# Patient Record
Sex: Male | Born: 1955 | Race: White | Hispanic: No | Marital: Single | Smoking: Former smoker
Health system: Southern US, Community
[De-identification: ages and names within clinical notes are randomized; demographics above are authoritative.]

## PROBLEM LIST (undated history)

## (undated) DIAGNOSIS — C801 Malignant (primary) neoplasm, unspecified: Secondary | ICD-10-CM

## (undated) DIAGNOSIS — Z87442 Personal history of urinary calculi: Secondary | ICD-10-CM

## (undated) DIAGNOSIS — I10 Essential (primary) hypertension: Secondary | ICD-10-CM

## (undated) DIAGNOSIS — M1990 Unspecified osteoarthritis, unspecified site: Secondary | ICD-10-CM

## (undated) DIAGNOSIS — R222 Localized swelling, mass and lump, trunk: Secondary | ICD-10-CM

## (undated) DIAGNOSIS — K219 Gastro-esophageal reflux disease without esophagitis: Secondary | ICD-10-CM

## (undated) HISTORY — PX: OTHER SURGICAL HISTORY: SHX169

## (undated) HISTORY — PX: SHOULDER SURGERY: SHX246

## (undated) HISTORY — DX: Unspecified osteoarthritis, unspecified site: M19.90

## (undated) HISTORY — DX: Localized swelling, mass and lump, trunk: R22.2

## (undated) HISTORY — DX: Gastro-esophageal reflux disease without esophagitis: K21.9

## (undated) HISTORY — DX: Malignant (primary) neoplasm, unspecified: C80.1

## (undated) HISTORY — DX: Personal history of urinary calculi: Z87.442

## (undated) HISTORY — DX: Essential (primary) hypertension: I10

---

## 2007-11-30 DIAGNOSIS — G4700 Insomnia, unspecified: Secondary | ICD-10-CM | POA: Insufficient documentation

## 2020-03-04 ENCOUNTER — Telehealth: Payer: Self-pay | Admitting: Hematology and Oncology

## 2020-03-04 NOTE — Telephone Encounter (Signed)
I spoke to [place], RN from [facility] regarding Dr. Calton Dach recommendations to sending the pt to general surgery for workup. [place] aware once workup is completed by the surgeon, the pt will be referred to our office.

## 2020-04-02 ENCOUNTER — Other Ambulatory Visit: Payer: Self-pay | Admitting: Surgery

## 2020-04-02 DIAGNOSIS — R911 Solitary pulmonary nodule: Secondary | ICD-10-CM

## 2020-04-14 ENCOUNTER — Ambulatory Visit (HOSPITAL_COMMUNITY)
Admission: RE | Admit: 2020-04-14 | Discharge: 2020-04-14 | Disposition: A | Discharge status: Home / Self Care | Payer: Medicare Other | Source: Ambulatory Visit | Attending: Surgery | Admitting: Surgery

## 2020-04-14 ENCOUNTER — Other Ambulatory Visit: Payer: Self-pay

## 2020-04-14 DIAGNOSIS — R911 Solitary pulmonary nodule: Secondary | ICD-10-CM | POA: Insufficient documentation

## 2020-04-14 DIAGNOSIS — R599 Enlarged lymph nodes, unspecified: Secondary | ICD-10-CM | POA: Insufficient documentation

## 2020-04-14 LAB — GLUCOSE, CAPILLARY
Glucose-Capillary: 48 mg/dL — ABNORMAL LOW (ref 70–99)
Glucose-Capillary: 99 mg/dL (ref 70–99)

## 2020-04-14 MED ORDER — FLUDEOXYGLUCOSE F - 18 (FDG) INJECTION
10.1000 | Freq: Once | INTRAVENOUS | Status: AC | PRN
  Administered 2020-04-14: 10.1 via INTRAVENOUS

## 2020-04-24 ENCOUNTER — Ambulatory Visit (INDEPENDENT_AMBULATORY_CARE_PROVIDER_SITE_OTHER): Payer: Medicare Other | Admitting: Internal Medicine

## 2020-04-24 ENCOUNTER — Other Ambulatory Visit: Payer: Self-pay

## 2020-04-24 DIAGNOSIS — R911 Solitary pulmonary nodule: Secondary | ICD-10-CM

## 2020-04-24 LAB — PULMONARY FUNCTION TEST
DL/VA % pred: 105 %
DL/VA: 4.36 ml/min/mmHg/L
DLCO cor % pred: 90 %
DLCO cor: 24.07 ml/min/mmHg
DLCO unc % pred: 90 %
DLCO unc: 24.07 ml/min/mmHg
FEF 25-75 Post: 3.42 L/sec
FEF 25-75 Pre: 2.73 L/sec
FEF2575-%Change-Post: 25 %
FEF2575-%Pred-Post: 127 %
FEF2575-%Pred-Pre: 102 %
FEV1-%Change-Post: 6 %
FEV1-%Pred-Post: 86 %
FEV1-%Pred-Pre: 80 %
FEV1-Post: 2.94 L
FEV1-Pre: 2.75 L
FEV1FVC-%Change-Post: 3 %
FEV1FVC-%Pred-Pre: 107 %
FEV6-%Change-Post: 3 %
FEV6-%Pred-Post: 81 %
FEV6-%Pred-Pre: 79 %
FEV6-Post: 3.55 L
FEV6-Pre: 3.44 L
FEV6FVC-%Change-Post: 0 %
FEV6FVC-%Pred-Post: 105 %
FEV6FVC-%Pred-Pre: 105 %
FVC-%Change-Post: 3 %
FVC-%Pred-Post: 77 %
FVC-%Pred-Pre: 75 %
FVC-Post: 3.56 L
FVC-Pre: 3.44 L
Post FEV1/FVC ratio: 83 %
Post FEV6/FVC ratio: 100 %
Pre FEV1/FVC ratio: 80 %
Pre FEV6/FVC Ratio: 100 %
RV % pred: 89 %
RV: 2.11 L
TLC % pred: 79 %
TLC: 5.59 L

## 2020-04-24 NOTE — Progress Notes (Signed)
Full PFT completed today ? ?

## 2020-04-30 ENCOUNTER — Other Ambulatory Visit: Payer: Self-pay

## 2020-04-30 DIAGNOSIS — N529 Male erectile dysfunction, unspecified: Secondary | ICD-10-CM | POA: Insufficient documentation

## 2020-04-30 DIAGNOSIS — I10 Essential (primary) hypertension: Secondary | ICD-10-CM | POA: Insufficient documentation

## 2020-05-01 ENCOUNTER — Other Ambulatory Visit: Payer: Self-pay | Admitting: *Deleted

## 2020-05-01 ENCOUNTER — Other Ambulatory Visit: Payer: Self-pay

## 2020-05-01 ENCOUNTER — Institutional Professional Consult (permissible substitution) (INDEPENDENT_AMBULATORY_CARE_PROVIDER_SITE_OTHER): Payer: Medicare Other | Admitting: Thoracic Surgery (Cardiothoracic Vascular Surgery)

## 2020-05-01 ENCOUNTER — Encounter: Payer: Self-pay | Admitting: Thoracic Surgery (Cardiothoracic Vascular Surgery)

## 2020-05-01 VITALS — BP 155/88 | HR 60 | Temp 98.1°F | Resp 20 | Ht 71.0 in | Wt 201.0 lb

## 2020-05-01 DIAGNOSIS — R222 Localized swelling, mass and lump, trunk: Secondary | ICD-10-CM | POA: Insufficient documentation

## 2020-05-01 DIAGNOSIS — R911 Solitary pulmonary nodule: Secondary | ICD-10-CM

## 2020-05-01 NOTE — Progress Notes (Signed)
[address]       [place] [address]             [phone]                    [name] Medical Record #[id number] Date of Birth: 1956/01/06  Referring: Jesusita Oka, MD Primary Care: Patient, No Pcp Per Primary Cardiologist: No primary care provider on file.  Chief Complaint:    Chief Complaint  Patient presents with  . Lung Lesion    Surgical consult, PFT's 04/24/20, PET Scan 04/14/20, CTA Chest 03/12/20    History of Present Illness:    [name] 64 y.o. male presents for surgical evaluation of a right-sided pleural based chest wall mass.  The patient states that in July of this year he sought care for right axillary pain and swelling.  He was prescribed antibiotics by an urgent care and had some improvement but the pain persisted.  He then presented to the emergency department where he underwent cross-sectional imaging which identified a right pleural-based mass.  In regards to his symptoms he complains of throbbing pain starting in his back with numbness and tingling that radiates around his chest wall and sticks to 1 main band.  The pain is constant he is unsure of any relieving or aggravating factors.  He states that he occasionally does have some shortness of breath when climbing up 2 flights of stairs but has 25 acres and walks it regularly without any chest pain or dyspnea.  His weight and energy have been stable.  He has had some stress about what is going on with his pain.  He last had a colonoscopy about 4 years ago at which time polyps were removed but there was no evidence of malignancy.  This is all per his report.  Smoking Hx: Quit smoking in 1984   Zubrod Score: At the time of surgery this patient's most appropriate activity status/level should be described as: [x]     0    Normal activity, no symptoms []     1    Restricted in physical strenuous activity but ambulatory, able to do out light work []     2     Ambulatory and capable of self care, unable to do work activities, up and about               >50 % of waking hours                              []     3    Only limited self care, in bed greater than 50% of waking hours []     4    Completely disabled, no self care, confined to bed or chair []     5    Moribund   Past Medical History:  Diagnosis Date  . Chest wall mass   . GERD (gastroesophageal reflux disease)   . Hypertension      No family history on file.   Social History   Tobacco Use  Smoking Status Former Smoker  Tobacco Comment   stoppedf smoking cigarettes in 1985    Social History   Substance and Sexual Activity  Alcohol Use Yes     Allergies  Allergen Reactions  . Aspirin Other (See Comments)  . Ibuprofen     Other reaction(s): Abdominal Pain  Current Outpatient Medications  Medication Sig Dispense Refill  . bisoprolol-hydrochlorothiazide (ZIAC) 10-6.25 MG tablet bisoprolol 10 mg-hydrochlorothiazide 6.25 mg tablet  TAKE 1 TABLET BY MOUTH ONCE DAILY AS DIRECTED FOR 30 DAYS    . gabapentin (NEURONTIN) 100 MG capsule TAKE 1 CAPSULE BY MOUTH EVERY 8 HOURS FOR 1 WEEK THEN TAKE 2 CAPSULES EVERY 8 HOURS FOR 1 WEEK THEN TAKE 3 CAPSULES EVERY 8 HOURS    . HYDROcodone-acetaminophen (NORCO/VICODIN) 5-325 MG tablet     . omeprazole (PRILOSEC) 20 MG capsule Take 20 mg by mouth daily.    . pravastatin (PRAVACHOL) 10 MG tablet Take by mouth.    Marland Kitchen tiZANidine (ZANAFLEX) 4 MG tablet Take 4 mg by mouth every 6 (six) hours as needed.     No current facility-administered medications for this visit.    Review of Systems  Constitutional: Negative.   Respiratory: Positive for shortness of breath.   Cardiovascular: Negative.   Gastrointestinal: Negative.   Musculoskeletal: Positive for back pain and myalgias.  Neurological: Positive for tingling.  Psychiatric/Behavioral: The patient is nervous/anxious.      PHYSICAL EXAMINATION: BP (!) 155/88   Pulse 60   Temp 98.1  F (36.7 C) (Skin)   Resp 20   Ht 5\' 11"  (1.803 m)   Wt 201 lb (91.2 kg)   SpO2 98% Comment: RA  BMI 28.03 kg/m  Physical Exam Constitutional:      General: He is not in acute distress.    Appearance: Normal appearance. He is normal weight. He is not ill-appearing.  HENT:     Head: Normocephalic and atraumatic.  Eyes:     Extraocular Movements: Extraocular movements intact.     Conjunctiva/sclera: Conjunctivae normal.  Cardiovascular:     Rate and Rhythm: Normal rate.  Pulmonary:     Effort: Pulmonary effort is normal. No respiratory distress.  Abdominal:     General: Abdomen is flat. There is no distension.  Musculoskeletal:        General: Normal range of motion.     Cervical back: Normal range of motion.     Comments: No obvious chest wall defect.  Skin:    General: Skin is warm and dry.  Neurological:     General: No focal deficit present.     Mental Status: He is alert and oriented to person, place, and time.     Sensory: Sensory deficit present.     Comments: Sensory deficits along the lateral right and anterior chest wall.     Diagnostic Studies & Laboratory data:     Recent Radiology Findings:   NM PET Image Initial (PI) Skull Base To Thigh  Result Date: 04/14/2020 CLINICAL DATA:  Initial treatment strategy for lung nodule. EXAM: NUCLEAR MEDICINE PET SKULL BASE TO THIGH TECHNIQUE: 10.1 mCi F-18 FDG was injected intravenously. Full-ring PET imaging was performed from the skull base to thigh after the radiotracer. CT data was obtained and used for attenuation correction and anatomic localization. Fasting blood glucose: 99 mg/dl COMPARISON:  None. FINDINGS: Mediastinal blood pool activity: SUV max 2.4 Liver activity: SUV max 3.66 NECK: No hypermetabolic lymph nodes in the neck. Incidental CT findings: none CHEST: Soft tissue nodule along the peripheral RIGHT chest appears extrapleural based on the relationship to extrapleural fat and the appearance of "ball under a rug"  this measures 2.1 x 1.6 cm (image 77, series 4) (SUVmax = 3.4) Mild increased FDG uptake about the RIGHT shoulder with scattered small lymph nodes in this area displaying fatty  hila. No focal lesion seen in the area of the RIGHT shoulder and most of this activity appears muscular or related to shoulder degenerative changes. Small lymph node on image 62 of series 4 with a maximum SUV of 3.6. No LEFT axillary nodal activity at least none with the same degree of activity. Incidental CT findings: Lungs are clear. No effusion. No consolidation. Airways are Calcified generalized atherosclerosis and coronary artery disease. Normal heart size. No pericardial effusion. Normal appearance of the esophagus. ABDOMEN/PELVIS: No focal hepatic uptake, splenic uptake or solid organ uptake. Increased metabolic activity of periportal lymph nodes (image 113, series 4) 1.2 cm celiac lymph node (SUVmax = 5.5) On the same image a similar size lymph node, portacaval lymph node with increased metabolic activity (SUVmax = 5.5) No additional suspicious areas of uptake in the abdomen or pelvis. Incidental CT findings: Splenic activity less than liver without splenomegaly. No acute gastrointestinal process. Calcified atheromatous plaque of the abdominal aorta, mild and without aneurysmal dilation. SKELETON: No focal hypermetabolic activity to suggest skeletal metastasis. Incidental CT findings: Extensive spinal degenerative changes. IMPRESSION: 1. Nodule that appears extrapleural associated with small amount calcification along the RIGHT chest wall shows low level FDG uptake and well-circumscribed margins. Would consider fibrous tumor of the pleura. Indolent neoplasm is favored. A metastatic lesion could have a similar appearance. Correlate with any history of neoplasm. 2. Increased FDG uptake about periportal lymph nodes greater than liver with mild enlargement are of uncertain significance. Metastatic process or reactive changes are in the  differential. There is some mild nodularity of hepatic contours as well which raises the question of background liver disease. Correlation with liver enzymes may be helpful. 3. Mild activity about the RIGHT shoulder and small nodes in the RIGHT axilla. By report the patient received COVID-19 vaccination but history states LEFT arm. Correlate with the side of vaccination as these findings could be explained by COVID-19 vaccine and consider clinical follow-up based on appearance. Electronically Signed   By: Zetta Bills M.D.   On: 04/14/2020 10:04       I have independently reviewed the above radiology studies  and reviewed the findings with the patient.   Recent Lab Findings: No results found for: WBC, HGB, HCT, PLT, GLUCOSE, CHOL, TRIG, HDL, LDLDIRECT, LDLCALC, ALT, AST, NA, K, CL, CREATININE, BUN, CO2, TSH, INR, GLUF, HGBA1C    Problem List: 2.1 cm right lateral pleural-based nodule. Hepatic lymphadenopathy with increased SUV uptake.  Assessment / Plan:   64 year old male with a 2.1 cm lateral chest wall lesion.  It appears pleural based on imaging.  There is not significant uptake on PET CT, and there is evidence of calcifications within the lesion indicating some chronicity.  Given his symptoms I am concerned that there is an intercostal nerve involvement and explained to him that for resection the paresthesias would improve proved but he likely would have some continued numbness.  Additionally he has some hepatic lymphadenopathy with increased SUV uptake which is concerning.  There is concern that this along with the pleural-based lesion could be metastatic disease from an unknown source.  I recommended that he undergo right robotic assisted thoracoscopy with resection of this pleural-based mass.  Hopefully this will help [place] better delineate the potential source of his abdominal lymphadenopathy as well.  I will also discuss his case with [name] for any recommendations for his abdominal  lymphadenopathy.  The risks, and benefits have been discussed and he is agreeable to proceed.  He is tentatively  scheduled for September 29 as a first case.     I  spent 40 minutes with  the patient face to face and greater then 50% of the time was spent in counseling and coordination of care.    Lajuana Matte 05/01/2020 12:47 PM

## 2020-05-01 NOTE — Pre-Procedure Instructions (Addendum)
[name]  05/01/2020    Your procedure is scheduled on Wednesday, Sept. 29, 2021 at 7:30 AM.   Report to [facility] Entrance "A" Admitting Office at 5:30 AM.   Call this number if you have problems the morning of surgery: [phone]   Questions prior to day of surgery, please call [phone] between 8 & 4 PM.   Remember:  Do not eat or drink after midnight Tuesday, 05/05/20.  Take these medicines the morning of surgery with A SIP OF WATER: Bisoprolol-Hydrochlorathiazide (Ziac), Omeprazole (Prilosec), Hydrocodone - if needed, Tizanidine (Zanaflex) - if needed  Do not use any NSAIDS (Ibuprofen, Aleve, etc), Aspirin containing products, Herbal medications, Multivitamins or Fish Oil prior to surgery.    Do not wear jewelry.  Do not wear lotions, powders, cologne or deodorant.  Men may shave face and neck.  Do not bring valuables to the hospital.  [facility] is not responsible for any belongings or valuables.  Contacts, dentures or bridgework may not be worn into surgery.  Leave your suitcase in the car.  After surgery it may be brought to your room.  For patients admitted to the hospital, discharge time will be determined by your treatment team.  [facility] - Preparing for Surgery  Before surgery, you can play an important role.  Because skin is not sterile, your skin needs to be as free of germs as possible.  You can reduce the number of germs on you skin by washing with CHG (chlorahexidine gluconate) soap before surgery.  CHG is an antiseptic cleaner which kills germs and bonds with the skin to continue killing germs even after washing.  Oral Hygiene is also important in reducing the risk of infection.  Remember to brush your teeth with your regular toothpaste the morning of surgery.  Please DO NOT use if you have an allergy to CHG or antibacterial soaps.  If your skin becomes reddened/irritated stop using the CHG and inform your nurse when you arrive at Short  Stay.  Do not shave (including legs and underarms) for at least 48 hours prior to the first CHG shower.  You may shave your face.  Please follow these instructions carefully:   1.  Shower with CHG Soap the night before surgery and the morning of Surgery.  2.  If you choose to wash your hair, wash your hair first as usual with your normal shampoo.  3.  After you shampoo, rinse your hair and body thoroughly to remove the shampoo. 4.  Use CHG as you would any other liquid soap.  You can apply chg directly to the skin and wash gently with a      scrungie or washcloth.           5.  Apply the CHG Soap to your body ONLY FROM THE NECK DOWN.   Do not use on open wounds or open sores. Avoid contact with your eyes, ears, mouth and genitals (private parts).  Wash genitals (private parts) with your normal soap - do this prior to using CHG soap.  6.  Wash thoroughly, paying special attention to the area where your surgery will be performed.  7.  Thoroughly rinse your body with warm water from the neck down.  8.  DO NOT shower/wash with your normal soap after using and rinsing off the CHG Soap.  9.  Pat yourself dry with a clean towel.            10.  Wear clean  pajamas.            11.  Place clean sheets on your bed the night of your first shower and do not sleep with pets.  Day of Surgery  Shower as above. Do not apply any lotions/deodorants the morning of surgery.   Please wear clean clothes to the hospital. Remember to brush your teeth with toothpaste.  Please read over the fact sheets that you were given.

## 2020-05-04 ENCOUNTER — Encounter (HOSPITAL_COMMUNITY): Payer: Self-pay

## 2020-05-04 ENCOUNTER — Other Ambulatory Visit (HOSPITAL_COMMUNITY)
Admission: RE | Admit: 2020-05-04 | Discharge: 2020-05-04 | Disposition: A | Discharge status: Home / Self Care | Payer: Medicare Other | Source: Ambulatory Visit | Attending: Thoracic Surgery (Cardiothoracic Vascular Surgery) | Admitting: Thoracic Surgery (Cardiothoracic Vascular Surgery)

## 2020-05-04 ENCOUNTER — Other Ambulatory Visit: Payer: Self-pay

## 2020-05-04 ENCOUNTER — Ambulatory Visit (HOSPITAL_COMMUNITY)
Admission: RE | Admit: 2020-05-04 | Discharge: 2020-05-04 | Disposition: A | Discharge status: Home / Self Care | Payer: Medicare Other | Source: Ambulatory Visit | Attending: Thoracic Surgery (Cardiothoracic Vascular Surgery) | Admitting: Thoracic Surgery (Cardiothoracic Vascular Surgery)

## 2020-05-04 ENCOUNTER — Encounter (HOSPITAL_COMMUNITY)
Admission: RE | Admit: 2020-05-04 | Discharge: 2020-05-04 | Disposition: A | Discharge status: Home / Self Care | Payer: Medicare Other | Source: Ambulatory Visit | Attending: Thoracic Surgery (Cardiothoracic Vascular Surgery) | Admitting: Thoracic Surgery (Cardiothoracic Vascular Surgery)

## 2020-05-04 DIAGNOSIS — Z01818 Encounter for other preprocedural examination: Secondary | ICD-10-CM | POA: Insufficient documentation

## 2020-05-04 DIAGNOSIS — Z20822 Contact with and (suspected) exposure to covid-19: Secondary | ICD-10-CM | POA: Insufficient documentation

## 2020-05-04 LAB — TYPE AND SCREEN
ABO/RH(D): A POS
Antibody Screen: NEGATIVE

## 2020-05-04 LAB — CBC
HCT: 42.9 % (ref 39.0–52.0)
Hemoglobin: 13.6 g/dL (ref 13.0–17.0)
MCH: 27.6 pg (ref 26.0–34.0)
MCHC: 31.7 g/dL (ref 30.0–36.0)
MCV: 87.2 fL (ref 80.0–100.0)
Platelets: 205 10*3/uL (ref 150–400)
RBC: 4.92 MIL/uL (ref 4.22–5.81)
RDW: 13.2 % (ref 11.5–15.5)
WBC: 7.3 10*3/uL (ref 4.0–10.5)
nRBC: 0 % (ref 0.0–0.2)

## 2020-05-04 LAB — COMPREHENSIVE METABOLIC PANEL
ALT: 28 U/L (ref 0–44)
AST: 28 U/L (ref 15–41)
Albumin: 4.2 g/dL (ref 3.5–5.0)
Alkaline Phosphatase: 66 U/L (ref 38–126)
Anion gap: 12 (ref 5–15)
BUN: 14 mg/dL (ref 8–23)
CO2: 25 mmol/L (ref 22–32)
Calcium: 8.9 mg/dL (ref 8.9–10.3)
Chloride: 102 mmol/L (ref 98–111)
Creatinine, Ser: 1.3 mg/dL — ABNORMAL HIGH (ref 0.61–1.24)
GFR calc Af Amer: >60 mL/min (ref >60)
GFR calc non Af Amer: 57 mL/min — ABNORMAL LOW (ref >60)
Glucose, Bld: 93 mg/dL (ref 70–99)
Potassium: 3.9 mmol/L (ref 3.5–5.1)
Sodium: 139 mmol/L (ref 135–145)
Total Bilirubin: 1.1 mg/dL (ref 0.3–1.2)
Total Protein: 7 g/dL (ref 6.5–8.1)

## 2020-05-04 LAB — URINALYSIS, ROUTINE W REFLEX MICROSCOPIC
Bilirubin Urine: NEGATIVE
Glucose, UA: NEGATIVE mg/dL
Hgb urine dipstick: NEGATIVE
Ketones, ur: NEGATIVE mg/dL
Leukocytes,Ua: NEGATIVE
Nitrite: NEGATIVE
Protein, ur: NEGATIVE mg/dL
Specific Gravity, Urine: 1.016 (ref 1.005–1.030)
pH: 6 (ref 5.0–8.0)

## 2020-05-04 LAB — SURGICAL PCR SCREEN
MRSA, PCR: NEGATIVE
Staphylococcus aureus: NEGATIVE

## 2020-05-04 LAB — PROTIME-INR
INR: 1 (ref 0.8–1.2)
Prothrombin Time: 13 seconds (ref 11.4–15.2)

## 2020-05-04 LAB — APTT: aPTT: 32 seconds (ref 24–36)

## 2020-05-04 LAB — SARS CORONAVIRUS 2 (TAT 6-24 HRS): SARS Coronavirus 2: NEGATIVE

## 2020-05-04 NOTE — Progress Notes (Signed)
PCP - [facility] [place], [place]  Chest x-ray - today EKG - today PFT's - 04/02/20  COVID TEST- 05/04/20   Anesthesia review: yes  Patient denies shortness of breath, fever, cough and chest pain at PAT appointment   All instructions explained to the patient, with a verbal understanding of the material. Patient agrees to go over the instructions while at home for a better understanding. Patient also instructed to self quarantine after being tested for COVID-19. The opportunity to ask questions was provided.

## 2020-05-04 NOTE — Progress Notes (Signed)
Lab tech was attempting to get an ABG done, pt became pale and diaphoretic. Pt's pulse was strong, steady and rate was 60. EKG that had been done just prior to episode, heart was 59. Pt was placed in supine position with legs up. Pt given Sprite to drink. Offered pt graham crackers and peanut butter, but he declined. Pt had not eaten prior to arrival.   After lying flat pt's color returned, no longer diaphoretic and pt states he feels much better. Pt had his CXR done without incident and is now on his way home.

## 2020-05-06 ENCOUNTER — Inpatient Hospital Stay (HOSPITAL_COMMUNITY): Payer: Medicare Other | Admitting: Certified Registered Nurse Anesthetist

## 2020-05-06 ENCOUNTER — Encounter (HOSPITAL_COMMUNITY): Payer: Self-pay | Admitting: Thoracic Surgery (Cardiothoracic Vascular Surgery)

## 2020-05-06 ENCOUNTER — Other Ambulatory Visit: Payer: Self-pay

## 2020-05-06 ENCOUNTER — Encounter (HOSPITAL_COMMUNITY)
Admission: RE | Disposition: A | Discharge status: Home / Self Care | Payer: Self-pay | Source: Home / Self Care | Attending: Thoracic Surgery (Cardiothoracic Vascular Surgery)

## 2020-05-06 ENCOUNTER — Inpatient Hospital Stay (HOSPITAL_COMMUNITY): Payer: Medicare Other | Admitting: Physician Assistant

## 2020-05-06 ENCOUNTER — Inpatient Hospital Stay (HOSPITAL_COMMUNITY): Payer: Medicare Other

## 2020-05-06 ENCOUNTER — Inpatient Hospital Stay (HOSPITAL_COMMUNITY)
Admission: RE | Admit: 2020-05-06 | Discharge: 2020-05-07 | DRG: 581 | Disposition: A | Discharge status: Home / Self Care | Payer: Medicare Other | Attending: Thoracic Surgery (Cardiothoracic Vascular Surgery) | Admitting: Thoracic Surgery (Cardiothoracic Vascular Surgery)

## 2020-05-06 DIAGNOSIS — K219 Gastro-esophageal reflux disease without esophagitis: Secondary | ICD-10-CM | POA: Diagnosis present

## 2020-05-06 DIAGNOSIS — Z886 Allergy status to analgesic agent status: Secondary | ICD-10-CM

## 2020-05-06 DIAGNOSIS — R222 Localized swelling, mass and lump, trunk: Principal | ICD-10-CM | POA: Diagnosis present

## 2020-05-06 DIAGNOSIS — Z20822 Contact with and (suspected) exposure to covid-19: Secondary | ICD-10-CM | POA: Diagnosis present

## 2020-05-06 DIAGNOSIS — Z87891 Personal history of nicotine dependence: Secondary | ICD-10-CM

## 2020-05-06 DIAGNOSIS — Z79899 Other long term (current) drug therapy: Secondary | ICD-10-CM

## 2020-05-06 DIAGNOSIS — I10 Essential (primary) hypertension: Secondary | ICD-10-CM | POA: Diagnosis present

## 2020-05-06 DIAGNOSIS — Z9889 Other specified postprocedural states: Secondary | ICD-10-CM

## 2020-05-06 LAB — POCT I-STAT 7, (LYTES, BLD GAS, ICA,H+H)
Acid-Base Excess: 3 mmol/L — ABNORMAL HIGH (ref 0.0–2.0)
Bicarbonate: 27.4 mmol/L (ref 20.0–28.0)
Calcium, Ion: 1.23 mmol/L (ref 1.15–1.40)
HCT: 39 % (ref 39.0–52.0)
Hemoglobin: 13.3 g/dL (ref 13.0–17.0)
O2 Saturation: 97 %
Potassium: 3.6 mmol/L (ref 3.5–5.1)
Sodium: 138 mmol/L (ref 135–145)
TCO2: 29 mmol/L (ref 22–32)
pCO2 arterial: 39.5 mmHg (ref 32.0–48.0)
pH, Arterial: 7.45 (ref 7.350–7.450)
pO2, Arterial: 87 mmHg (ref 83.0–108.0)

## 2020-05-06 LAB — ABO/RH: ABO/RH(D): A POS

## 2020-05-06 LAB — GLUCOSE, CAPILLARY
Glucose-Capillary: 134 mg/dL — ABNORMAL HIGH (ref 70–99)
Glucose-Capillary: 129 mg/dL — ABNORMAL HIGH (ref 70–99)

## 2020-05-06 SURGERY — EXCISION, MASS, MEDIASTINUM, ROBOT-ASSISTED
Anesthesia: General | Site: Chest | Laterality: Right

## 2020-05-06 MED ORDER — HYDROMORPHONE HCL 1 MG/ML IJ SOLN
INTRAMUSCULAR | Status: AC
  Filled 2020-05-06: qty 1

## 2020-05-06 MED ORDER — PHENYLEPHRINE HCL-NACL 10-0.9 MG/250ML-% IV SOLN
INTRAVENOUS | Status: AC
  Filled 2020-05-06: qty 250

## 2020-05-06 MED ORDER — TRAMADOL HCL 50 MG PO TABS: 50.0000 mg | ORAL_TABLET | Freq: Four times a day (QID) | ORAL | Status: DC | PRN

## 2020-05-06 MED ORDER — LACTATED RINGERS IV SOLN: INTRAVENOUS | Status: DC | PRN

## 2020-05-06 MED ORDER — CHLORHEXIDINE GLUCONATE 0.12 % MT SOLN
OROMUCOSAL | Status: AC
  Administered 2020-05-06: 15 mL via OROMUCOSAL
  Filled 2020-05-06: qty 15

## 2020-05-06 MED ORDER — ONDANSETRON HCL 4 MG/2ML IJ SOLN
INTRAMUSCULAR | Status: DC | PRN
  Administered 2020-05-06: 4 mg via INTRAVENOUS

## 2020-05-06 MED ORDER — DIPHENHYDRAMINE HCL 25 MG PO CAPS
25.0000 mg | ORAL_CAPSULE | Freq: Every evening | ORAL | Status: DC | PRN
  Administered 2020-05-06: 25 mg via ORAL
  Filled 2020-05-06: qty 1

## 2020-05-06 MED ORDER — CHLORHEXIDINE GLUCONATE 0.12 % MT SOLN: 15.0000 mL | Freq: Once | OROMUCOSAL | Status: AC

## 2020-05-06 MED ORDER — DEXAMETHASONE SODIUM PHOSPHATE 10 MG/ML IJ SOLN
INTRAMUSCULAR | Status: AC
  Filled 2020-05-06: qty 1

## 2020-05-06 MED ORDER — BUPIVACAINE LIPOSOME 1.3 % IJ SUSP
20.0000 mL | Freq: Once | INTRAMUSCULAR | Status: DC
  Filled 2020-05-06: qty 20

## 2020-05-06 MED ORDER — ACETAMINOPHEN 160 MG/5ML PO SOLN
1000.0000 mg | Freq: Four times a day (QID) | ORAL | Status: DC
  Administered 2020-05-06: 1000 mg via ORAL
  Filled 2020-05-06: qty 40.6

## 2020-05-06 MED ORDER — 0.9 % SODIUM CHLORIDE (POUR BTL) OPTIME
TOPICAL | Status: DC | PRN
  Administered 2020-05-06: 2000 mL

## 2020-05-06 MED ORDER — PROPOFOL 10 MG/ML IV BOLUS
INTRAVENOUS | Status: AC
  Filled 2020-05-06: qty 40

## 2020-05-06 MED ORDER — ACETAMINOPHEN 160 MG/5ML PO SOLN: 325.0000 mg | ORAL | Status: DC | PRN

## 2020-05-06 MED ORDER — FENTANYL CITRATE (PF) 250 MCG/5ML IJ SOLN
INTRAMUSCULAR | Status: AC
  Filled 2020-05-06: qty 5

## 2020-05-06 MED ORDER — INSULIN ASPART 100 UNIT/ML ~~LOC~~ SOLN
0.0000 [IU] | Freq: Three times a day (TID) | SUBCUTANEOUS | Status: DC
  Administered 2020-05-06 (×2): 2 [IU] via SUBCUTANEOUS

## 2020-05-06 MED ORDER — ONDANSETRON HCL 4 MG/2ML IJ SOLN: 4.0000 mg | Freq: Once | INTRAMUSCULAR | Status: DC | PRN

## 2020-05-06 MED ORDER — DEXAMETHASONE SODIUM PHOSPHATE 10 MG/ML IJ SOLN
INTRAMUSCULAR | Status: DC | PRN
  Administered 2020-05-06: 10 mg via INTRAVENOUS

## 2020-05-06 MED ORDER — ORAL CARE MOUTH RINSE: 15.0000 mL | Freq: Once | OROMUCOSAL | Status: AC

## 2020-05-06 MED ORDER — LIDOCAINE 2% (20 MG/ML) 5 ML SYRINGE
INTRAMUSCULAR | Status: DC | PRN
  Administered 2020-05-06: 80 mg via INTRAVENOUS

## 2020-05-06 MED ORDER — FENTANYL CITRATE (PF) 250 MCG/5ML IJ SOLN
INTRAMUSCULAR | Status: DC | PRN
  Administered 2020-05-06: 100 ug via INTRAVENOUS
  Administered 2020-05-06 (×3): 50 ug via INTRAVENOUS

## 2020-05-06 MED ORDER — ACETAMINOPHEN 325 MG PO TABS: 325.0000 mg | ORAL_TABLET | ORAL | Status: DC | PRN

## 2020-05-06 MED ORDER — ENOXAPARIN SODIUM 40 MG/0.4ML ~~LOC~~ SOLN: 40.0000 mg | Freq: Every day | SUBCUTANEOUS | Status: DC

## 2020-05-06 MED ORDER — ACETAMINOPHEN 500 MG PO TABS
1000.0000 mg | ORAL_TABLET | Freq: Four times a day (QID) | ORAL | Status: DC
  Administered 2020-05-06: 1000 mg via ORAL
  Filled 2020-05-06 (×2): qty 2

## 2020-05-06 MED ORDER — LIDOCAINE 2% (20 MG/ML) 5 ML SYRINGE
INTRAMUSCULAR | Status: AC
  Filled 2020-05-06: qty 5

## 2020-05-06 MED ORDER — SODIUM CHLORIDE FLUSH 0.9 % IV SOLN
INTRAVENOUS | Status: DC | PRN
  Administered 2020-05-06: 100 mL

## 2020-05-06 MED ORDER — CEFAZOLIN SODIUM-DEXTROSE 2-4 GM/100ML-% IV SOLN
2.0000 g | INTRAVENOUS | Status: AC
  Administered 2020-05-06: 2 g via INTRAVENOUS
  Filled 2020-05-06: qty 100

## 2020-05-06 MED ORDER — MIDAZOLAM HCL 5 MG/5ML IJ SOLN
INTRAMUSCULAR | Status: DC | PRN
  Administered 2020-05-06: 2 mg via INTRAVENOUS

## 2020-05-06 MED ORDER — SUGAMMADEX SODIUM 200 MG/2ML IV SOLN
INTRAVENOUS | Status: DC | PRN
  Administered 2020-05-06: 200 mg via INTRAVENOUS

## 2020-05-06 MED ORDER — CEFAZOLIN SODIUM-DEXTROSE 2-4 GM/100ML-% IV SOLN
2.0000 g | Freq: Three times a day (TID) | INTRAVENOUS | Status: AC
  Administered 2020-05-06 (×2): 2 g via INTRAVENOUS
  Filled 2020-05-06 (×3): qty 100

## 2020-05-06 MED ORDER — ONDANSETRON HCL 4 MG/2ML IJ SOLN
INTRAMUSCULAR | Status: AC
  Filled 2020-05-06: qty 2

## 2020-05-06 MED ORDER — FENTANYL CITRATE (PF) 250 MCG/5ML IJ SOLN
INTRAMUSCULAR | Status: AC
Start: 2020-05-06 — End: ?
  Filled 2020-05-06: qty 5

## 2020-05-06 MED ORDER — LACTATED RINGERS IV SOLN: INTRAVENOUS | Status: DC

## 2020-05-06 MED ORDER — PANTOPRAZOLE SODIUM 40 MG PO TBEC: 40.0000 mg | DELAYED_RELEASE_TABLET | Freq: Every day | ORAL | Status: DC

## 2020-05-06 MED ORDER — ROCURONIUM BROMIDE 10 MG/ML (PF) SYRINGE
PREFILLED_SYRINGE | INTRAVENOUS | Status: AC
  Filled 2020-05-06: qty 10

## 2020-05-06 MED ORDER — HYDROMORPHONE HCL 1 MG/ML IJ SOLN
0.2500 mg | INTRAMUSCULAR | Status: DC | PRN
  Administered 2020-05-06 (×4): 0.5 mg via INTRAVENOUS

## 2020-05-06 MED ORDER — PHENYLEPHRINE HCL-NACL 10-0.9 MG/250ML-% IV SOLN
INTRAVENOUS | Status: DC | PRN
  Administered 2020-05-06: 25 ug/min via INTRAVENOUS

## 2020-05-06 MED ORDER — ONDANSETRON HCL 4 MG/2ML IJ SOLN: 4.0000 mg | Freq: Four times a day (QID) | INTRAMUSCULAR | Status: DC | PRN

## 2020-05-06 MED ORDER — MIDAZOLAM HCL 2 MG/2ML IJ SOLN
INTRAMUSCULAR | Status: AC
  Filled 2020-05-06: qty 2

## 2020-05-06 MED ORDER — KETOROLAC TROMETHAMINE 15 MG/ML IJ SOLN
15.0000 mg | Freq: Four times a day (QID) | INTRAMUSCULAR | Status: DC | PRN
  Administered 2020-05-06: 15 mg via INTRAVENOUS
  Filled 2020-05-06: qty 1

## 2020-05-06 MED ORDER — BISACODYL 5 MG PO TBEC
10.0000 mg | DELAYED_RELEASE_TABLET | Freq: Every day | ORAL | Status: DC
  Administered 2020-05-06: 10 mg via ORAL
  Filled 2020-05-06: qty 2

## 2020-05-06 MED ORDER — PROPOFOL 10 MG/ML IV BOLUS
INTRAVENOUS | Status: DC | PRN
  Administered 2020-05-06: 50 mg via INTRAVENOUS
  Administered 2020-05-06: 150 mg via INTRAVENOUS

## 2020-05-06 MED ORDER — SENNOSIDES-DOCUSATE SODIUM 8.6-50 MG PO TABS: 1.0000 | ORAL_TABLET | Freq: Every day | ORAL | Status: DC

## 2020-05-06 MED ORDER — BUPIVACAINE HCL (PF) 0.5 % IJ SOLN
INTRAMUSCULAR | Status: AC
  Filled 2020-05-06: qty 30

## 2020-05-06 MED ORDER — HEMOSTATIC AGENTS (NO CHARGE) OPTIME
TOPICAL | Status: DC | PRN
  Administered 2020-05-06: 1 via TOPICAL

## 2020-05-06 MED ORDER — ROCURONIUM BROMIDE 10 MG/ML (PF) SYRINGE
PREFILLED_SYRINGE | INTRAVENOUS | Status: DC | PRN
  Administered 2020-05-06: 60 mg via INTRAVENOUS
  Administered 2020-05-06: 40 mg via INTRAVENOUS

## 2020-05-06 SURGICAL SUPPLY — 83 items
APPLIER CLIP 5 13 M/L LIGAMAX5 (MISCELLANEOUS) ×3
BLADE 11 SAFETY STRL DISP (BLADE) ×3 IMPLANT
BLADE STERNUM SYSTEM 6 (BLADE) IMPLANT
CANNULA REDUC XI 12-8 STAPL (CANNULA) ×1
CANNULA REDUC XI 12-8MM STAPL (CANNULA) ×1
CANNULA REDUCER 12-8 DVNC XI (CANNULA) ×1 IMPLANT
CLIP APPLIE 5 13 M/L LIGAMAX5 (MISCELLANEOUS) ×1 IMPLANT
CNTNR URN SCR LID CUP LEK RST (MISCELLANEOUS) ×1 IMPLANT
CONNECTOR BLAKE 2:1 CARIO BLK (MISCELLANEOUS) IMPLANT
CONT SPEC 4OZ STRL OR WHT (MISCELLANEOUS) ×2
DEFOGGER SCOPE WARMER CLEARIFY (MISCELLANEOUS) ×3 IMPLANT
DERMABOND ADVANCED (GAUZE/BANDAGES/DRESSINGS) ×2
DERMABOND ADVANCED .7 DNX12 (GAUZE/BANDAGES/DRESSINGS) ×1 IMPLANT
DRAIN CHANNEL 19F RND (DRAIN) ×3 IMPLANT
DRAPE ARM DVNC X/XI (DISPOSABLE) ×4 IMPLANT
DRAPE CARDIOVASC SPLIT 88X140 (DRAPES) ×3 IMPLANT
DRAPE CARDIOVASCULAR INCISE (DRAPES)
DRAPE COLUMN DVNC XI (DISPOSABLE) ×1 IMPLANT
DRAPE DA VINCI XI ARM (DISPOSABLE) ×8
DRAPE DA VINCI XI COLUMN (DISPOSABLE) ×2
DRAPE ORTHO SPLIT 77X108 STRL (DRAPES) ×2
DRAPE SLUSH/WARMER DISC (DRAPES) IMPLANT
DRAPE SRG 135X102X78XABS (DRAPES) IMPLANT
DRAPE SURG ORHT 6 SPLT 77X108 (DRAPES) ×1 IMPLANT
DRAPE WARM FLUID 44X44 (DRAPES) IMPLANT
DRSG AQUACEL AG ADV 3.5X14 (GAUZE/BANDAGES/DRESSINGS) IMPLANT
ELECT REM PT RETURN 9FT ADLT (ELECTROSURGICAL)
ELECTRODE REM PT RTRN 9FT ADLT (ELECTROSURGICAL) IMPLANT
EVACUATOR SILICONE 100CC (DRAIN) ×3 IMPLANT
FELT TEFLON 1X6 (MISCELLANEOUS) IMPLANT
GAUZE KITTNER 4X5 RF (MISCELLANEOUS) ×3 IMPLANT
GAUZE KITTNER 4X8 (MISCELLANEOUS) ×3 IMPLANT
GAUZE SPONGE 4X4 12PLY STRL (GAUZE/BANDAGES/DRESSINGS) IMPLANT
GAUZE SPONGE 4X4 12PLY STRL LF (GAUZE/BANDAGES/DRESSINGS) ×3 IMPLANT
GLOVE BIO SURGEON STRL SZ7 (GLOVE) ×6 IMPLANT
GLOVE BIOGEL PI IND STRL 6.5 (GLOVE) ×1 IMPLANT
GLOVE BIOGEL PI INDICATOR 6.5 (GLOVE) ×2
GOWN STRL REUS W/ TWL LRG LVL3 (GOWN DISPOSABLE) ×1 IMPLANT
GOWN STRL REUS W/ TWL XL LVL3 (GOWN DISPOSABLE) ×1 IMPLANT
GOWN STRL REUS W/TWL LRG LVL3 (GOWN DISPOSABLE) ×2
GOWN STRL REUS W/TWL XL LVL3 (GOWN DISPOSABLE) ×2
HEMOSTAT POWDER SURGIFOAM 1G (HEMOSTASIS) IMPLANT
HEMOSTAT SURGICEL 2X14 (HEMOSTASIS) ×3 IMPLANT
IRRIGATION STRYKERFLOW (MISCELLANEOUS) ×1 IMPLANT
IRRIGATOR STRYKERFLOW (MISCELLANEOUS) ×3
KIT SUCTION CATH 14FR (SUCTIONS) IMPLANT
NEEDLE HYPO 25GX1X1/2 BEV (NEEDLE) ×3 IMPLANT
NEEDLE SPNL 22GX3.5 QUINCKE BK (NEEDLE) ×3 IMPLANT
PACK CHEST (CUSTOM PROCEDURE TRAY) ×3 IMPLANT
PAD ARMBOARD 7.5X6 YLW CONV (MISCELLANEOUS) ×6 IMPLANT
PAD ELECT DEFIB RADIOL ZOLL (MISCELLANEOUS) IMPLANT
PORT ACCESS TROCAR AIRSEAL 12 (TROCAR) ×1 IMPLANT
PORT ACCESS TROCAR AIRSEAL 5M (TROCAR) ×2
SEAL CANN UNIV 5-8 DVNC XI (MISCELLANEOUS) ×3 IMPLANT
SEAL XI 5MM-8MM UNIVERSAL (MISCELLANEOUS) ×6
SET TRI-LUMEN FLTR TB AIRSEAL (TUBING) ×3 IMPLANT
SHEET MEDIUM DRAPE 40X70 STRL (DRAPES) ×3 IMPLANT
STAPLER CANNULA SEAL DVNC XI (STAPLE) IMPLANT
STAPLER CANNULA SEAL XI (STAPLE)
STOPCOCK 4 WAY LG BORE MALE ST (IV SETS) IMPLANT
SUT BONE WAX W31G (SUTURE) IMPLANT
SUT MNCRL AB 3-0 PS2 18 (SUTURE) IMPLANT
SUT PDS AB 1 CTX 36 (SUTURE) IMPLANT
SUT SILK 2 0 SH CR/8 (SUTURE) ×3 IMPLANT
SUT STEEL 6MS V (SUTURE) IMPLANT
SUT STEEL SZ 6 DBL 3X14 BALL (SUTURE) IMPLANT
SUT VIC AB 2-0 CT1 27 (SUTURE) ×2
SUT VIC AB 2-0 CT1 TAPERPNT 27 (SUTURE) ×1 IMPLANT
SUT VIC AB 2-0 CTX 36 (SUTURE) IMPLANT
SUT VIC AB 3-0 SH 27 (SUTURE) ×4
SUT VIC AB 3-0 SH 27X BRD (SUTURE) ×2 IMPLANT
SUT VICRYL 0 TIES 12 18 (SUTURE) ×3 IMPLANT
SUT VICRYL 0 UR6 27IN ABS (SUTURE) ×6 IMPLANT
SYR 10ML LL (SYRINGE) ×3 IMPLANT
SYR 50ML LL SCALE MARK (SYRINGE) IMPLANT
SYR BULB IRRIG 60ML STRL (SYRINGE) ×3 IMPLANT
SYSTEM RETRIEVAL ANCHOR 8 (MISCELLANEOUS) ×3 IMPLANT
SYSTEM SAHARA CHEST DRAIN ATS (WOUND CARE) IMPLANT
TAPE PAPER 3X10 WHT MICROPORE (GAUZE/BANDAGES/DRESSINGS) ×3 IMPLANT
TOWEL GREEN STERILE (TOWEL DISPOSABLE) IMPLANT
TOWEL GREEN STERILE FF (TOWEL DISPOSABLE) IMPLANT
TRAY FOLEY SLVR 16FR TEMP STAT (SET/KITS/TRAYS/PACK) IMPLANT
TUBING EXTENTION W/L.L. (IV SETS) IMPLANT

## 2020-05-06 NOTE — Progress Notes (Signed)
Patient arrived to 4E room 09 at this time. Telemetry applied and CCMD notified. CHG bath complete. V/s and assessment done. JP drain incision clean dry and intact. 25 ml blood emptied at this time. Will continue to monitor.   Emelda Fear, RN

## 2020-05-06 NOTE — Brief Op Note (Signed)
05/06/2020  10:22 AM  PATIENT:  [name]  64 y.o. male  PRE-OPERATIVE DIAGNOSIS:  CHEST WALL MASS  POST-OPERATIVE DIAGNOSIS:  CHEST WALL MASS  PROCEDURE:  Procedure(s): RIGHT XI ROBOTIC ASSISTED THORASCOPY EXCISION OF CHEST WALL MASS AND INTERCOSTAL NERVE BLOCK (Right)  SURGEON:  Surgeon(s) and Role:    * Lightfoot, Lucile Crater, MD - Primary  PHYSICIAN ASSISTANT:   Nicholes Rough, PA-C   ANESTHESIA:   general  EBL:  25 mL   BLOOD ADMINISTERED:none  DRAINS: one straight drain   LOCAL MEDICATIONS USED:  BUPIVICAINE   SPECIMEN:  Source of Specimen:  right chest wall mass  DISPOSITION OF SPECIMEN:  PATHOLOGY  COUNTS:  YES  TOURNIQUET:  * No tourniquets in log *  DICTATION: .Dragon Dictation  PLAN OF CARE: Admit to inpatient   PATIENT DISPOSITION:  PACU - hemodynamically stable.   Delay start of Pharmacological VTE agent (>24hrs) due to surgical blood loss or risk of bleeding: no

## 2020-05-06 NOTE — H&P (Signed)
[address]       [place] [address]             [phone]       No events since his clinic appointment.  Pain is unchanged.  Alert NAD RRR EWOB  OR today for right RATS, chest wall mass excision.  Per my clinic note  [facility] Record #[id number] Date of Birth: 10/25/1955  Referring: Jesusita Oka, MD Primary Care: Patient, No Pcp Per Primary Cardiologist: No primary care provider on file.  Chief Complaint:        Chief Complaint  Patient presents with  . Lung Lesion    Surgical consult, PFT's 04/24/20, PET Scan 04/14/20, CTA Chest 03/12/20    History of Present Illness:    [name] 64 y.o. male presents for surgical evaluation of a right-sided pleural based chest wall mass.  The patient states that in July of this year he sought care for right axillary pain and swelling.  He was prescribed antibiotics by an urgent care and had some improvement but the pain persisted.  He then presented to the emergency department where he underwent cross-sectional imaging which identified a right pleural-based mass.  In regards to his symptoms he complains of throbbing pain starting in his back with numbness and tingling that radiates around his chest wall and sticks to 1 main band.  The pain is constant he is unsure of any relieving or aggravating factors.  He states that he occasionally does have some shortness of breath when climbing up 2 flights of stairs but has 25 acres and walks it regularly without any chest pain or dyspnea.  His weight and energy have been stable.  He has had some stress about what is going on with his pain.  He last had a colonoscopy about 4 years ago at which time polyps were removed but there was no evidence of malignancy.  This is all per his report.  Smoking Hx: Quit smoking in 1984   Zubrod Score: At the time of surgery this patient's most appropriate activity status/level should be  described as: [x] ?    0    Normal activity, no symptoms [] ?    1    Restricted in physical strenuous activity but ambulatory, able to do out light work [] ?    2    Ambulatory and capable of self care, unable to do work activities, up and about               >50 % of waking hours                              [] ?    3    Only limited self care, in bed greater than 50% of waking hours [] ?    4    Completely disabled, no self care, confined to bed or chair [] ?    5    Moribund       Past Medical History:  Diagnosis Date  . Chest wall mass   . GERD (gastroesophageal reflux disease)   . Hypertension      No family history on file.   Social History       Tobacco Use  Smoking Status Former Smoker  Tobacco Comment   stoppedf smoking cigarettes in 1985    Social History      Substance and Sexual Activity  Alcohol Use Yes          Allergies  Allergen Reactions  . Aspirin Other (See Comments)  . Ibuprofen     Other reaction(s): Abdominal Pain          Current Outpatient Medications  Medication Sig Dispense Refill  . bisoprolol-hydrochlorothiazide (ZIAC) 10-6.25 MG tablet bisoprolol 10 mg-hydrochlorothiazide 6.25 mg tablet  TAKE 1 TABLET BY MOUTH ONCE DAILY AS DIRECTED FOR 30 DAYS    . gabapentin (NEURONTIN) 100 MG capsule TAKE 1 CAPSULE BY MOUTH EVERY 8 HOURS FOR 1 WEEK THEN TAKE 2 CAPSULES EVERY 8 HOURS FOR 1 WEEK THEN TAKE 3 CAPSULES EVERY 8 HOURS    . HYDROcodone-acetaminophen (NORCO/VICODIN) 5-325 MG tablet     . omeprazole (PRILOSEC) 20 MG capsule Take 20 mg by mouth daily.    . pravastatin (PRAVACHOL) 10 MG tablet Take by mouth.    Marland Kitchen tiZANidine (ZANAFLEX) 4 MG tablet Take 4 mg by mouth every 6 (six) hours as needed.     No current facility-administered medications for this visit.    Review of Systems  Constitutional: Negative.   Respiratory: Positive for shortness of breath.   Cardiovascular: Negative.   Gastrointestinal:  Negative.   Musculoskeletal: Positive for back pain and myalgias.  Neurological: Positive for tingling.  Psychiatric/Behavioral: The patient is nervous/anxious.      PHYSICAL EXAMINATION: BP (!) 155/88   Pulse 60   Temp 98.1 F (36.7 C) (Skin)   Resp 20   Ht 5\' 11"  (1.803 m)   Wt 201 lb (91.2 kg)   SpO2 98% Comment: RA  BMI 28.03 kg/m  Physical Exam Constitutional:      General: He is not in acute distress.    Appearance: Normal appearance. He is normal weight. He is not ill-appearing.  HENT:     Head: Normocephalic and atraumatic.  Eyes:     Extraocular Movements: Extraocular movements intact.     Conjunctiva/sclera: Conjunctivae normal.  Cardiovascular:     Rate and Rhythm: Normal rate.  Pulmonary:     Effort: Pulmonary effort is normal. No respiratory distress.  Abdominal:     General: Abdomen is flat. There is no distension.  Musculoskeletal:        General: Normal range of motion.     Cervical back: Normal range of motion.     Comments: No obvious chest wall defect.  Skin:    General: Skin is warm and dry.  Neurological:     General: No focal deficit present.     Mental Status: He is alert and oriented to person, place, and time.     Sensory: Sensory deficit present.     Comments: Sensory deficits along the lateral right and anterior chest wall.     Diagnostic Studies & Laboratory data:     Recent Radiology Findings:   NM PET Image Initial (PI) Skull Base To Thigh  Result Date: 04/14/2020 CLINICAL DATA:  Initial treatment strategy for lung nodule. EXAM: NUCLEAR MEDICINE PET SKULL BASE TO THIGH TECHNIQUE: 10.1 mCi F-18 FDG was injected intravenously. Full-ring PET imaging was performed from the skull base to thigh after the radiotracer. CT data was obtained and used for attenuation correction and anatomic localization. Fasting blood glucose: 99 mg/dl COMPARISON:  None. FINDINGS: Mediastinal blood pool activity: SUV max 2.4 Liver activity: SUV max 3.66 NECK:  No hypermetabolic lymph nodes in the neck. Incidental CT findings: none CHEST: Soft tissue nodule along the peripheral RIGHT chest appears extrapleural based on the relationship  to extrapleural fat and the appearance of "ball under a rug" this measures 2.1 x 1.6 cm (image 77, series 4) (SUVmax = 3.4) Mild increased FDG uptake about the RIGHT shoulder with scattered small lymph nodes in this area displaying fatty hila. No focal lesion seen in the area of the RIGHT shoulder and most of this activity appears muscular or related to shoulder degenerative changes. Small lymph node on image 62 of series 4 with a maximum SUV of 3.6. No LEFT axillary nodal activity at least none with the same degree of activity. Incidental CT findings: Lungs are clear. No effusion. No consolidation. Airways are Calcified generalized atherosclerosis and coronary artery disease. Normal heart size. No pericardial effusion. Normal appearance of the esophagus. ABDOMEN/PELVIS: No focal hepatic uptake, splenic uptake or solid organ uptake. Increased metabolic activity of periportal lymph nodes (image 113, series 4) 1.2 cm celiac lymph node (SUVmax = 5.5) On the same image a similar size lymph node, portacaval lymph node with increased metabolic activity (SUVmax = 5.5) No additional suspicious areas of uptake in the abdomen or pelvis. Incidental CT findings: Splenic activity less than liver without splenomegaly. No acute gastrointestinal process. Calcified atheromatous plaque of the abdominal aorta, mild and without aneurysmal dilation. SKELETON: No focal hypermetabolic activity to suggest skeletal metastasis. Incidental CT findings: Extensive spinal degenerative changes. IMPRESSION: 1. Nodule that appears extrapleural associated with small amount calcification along the RIGHT chest wall shows low level FDG uptake and well-circumscribed margins. Would consider fibrous tumor of the pleura. Indolent neoplasm is favored. A metastatic lesion could have  a similar appearance. Correlate with any history of neoplasm. 2. Increased FDG uptake about periportal lymph nodes greater than liver with mild enlargement are of uncertain significance. Metastatic process or reactive changes are in the differential. There is some mild nodularity of hepatic contours as well which raises the question of background liver disease. Correlation with liver enzymes may be helpful. 3. Mild activity about the RIGHT shoulder and small nodes in the RIGHT axilla. By report the patient received COVID-19 vaccination but history states LEFT arm. Correlate with the side of vaccination as these findings could be explained by COVID-19 vaccine and consider clinical follow-up based on appearance. Electronically Signed   By: Zetta Bills M.D.   On: 04/14/2020 10:04       I have independently reviewed the above radiology studies  and reviewed the findings with the patient.   Recent Lab Findings: No results found for: WBC, HGB, HCT, PLT, GLUCOSE, CHOL, TRIG, HDL, LDLDIRECT, LDLCALC, ALT, AST, NA, K, CL, CREATININE, BUN, CO2, TSH, INR, GLUF, HGBA1C    Problem List: 2.1 cm right lateral pleural-based nodule. Hepatic lymphadenopathy with increased SUV uptake.  Assessment / Plan:   64 year old male with a 2.1 cm lateral chest wall lesion.  It appears pleural based on imaging.  There is not significant uptake on PET CT, and there is evidence of calcifications within the lesion indicating some chronicity.  Given his symptoms I am concerned that there is an intercostal nerve involvement and explained to him that for resection the paresthesias would improve proved but he likely would have some continued numbness.  Additionally he has some hepatic lymphadenopathy with increased SUV uptake which is concerning.  There is concern that this along with the pleural-based lesion could be metastatic disease from an unknown source.  I recommended that he undergo right robotic assisted  thoracoscopy with resection of this pleural-based mass.  Hopefully this will help [place] better delineate the potential  source of his abdominal lymphadenopathy as well.  I will also discuss his case with [name] for any recommendations for his abdominal lymphadenopathy.  The risks, and benefits have been discussed and he is agreeable to proceed.  He is tentatively scheduled for September 29 as a first case.

## 2020-05-06 NOTE — Anesthesia Preprocedure Evaluation (Addendum)
Anesthesia Evaluation  Patient identified by MRN, date of birth, ID band Patient awake    Reviewed: Patient's Chart, lab work & pertinent test results  Airway Mallampati: II  TM Distance: >3 FB Neck ROM: Full    Dental  (+) Teeth Intact, Partial Lower   Pulmonary neg pulmonary ROS, former smoker,    Pulmonary exam normal        Cardiovascular hypertension, Pt. on medications  Rhythm:Regular Rate:Normal     Neuro/Psych negative neurological ROS  negative psych ROS   GI/Hepatic Neg liver ROS, GERD  Medicated,  Endo/Other  negative endocrine ROS  Renal/GU negative Renal ROS  negative genitourinary   Musculoskeletal  (+) Arthritis , Osteoarthritis,  Chest wall mass   Abdominal (+)  Abdomen: soft. Bowel sounds: normal.  Peds  Hematology negative hematology ROS (+)   Anesthesia Other Findings   Reproductive/Obstetrics                           Anesthesia Physical Anesthesia Plan  ASA: III  Anesthesia Plan: General   Post-op Pain Management:    Induction: Intravenous  PONV Risk Score and Plan: 2 and Ondansetron, Dexamethasone and Treatment may vary due to age or medical condition  Airway Management Planned: Double Lumen EBT, Mask and Oral ETT  Additional Equipment: Arterial line  Intra-op Plan:   Post-operative Plan: Extubation in OR  Informed Consent: I have reviewed the patients History and Physical, chart, labs and discussed the procedure including the risks, benefits and alternatives for the proposed anesthesia with the patient or authorized representative who has indicated his/her understanding and acceptance.     Dental advisory given  Plan Discussed with: CRNA and Surgeon  Anesthesia Plan Comments: (Lab Results      Component                Value               Date                      WBC                      7.3                 05/04/2020                HGB                       13.3                05/06/2020                HCT                      39.0                05/06/2020                MCV                      87.2                05/04/2020                PLT  205                 05/04/2020           Covid-19 Nucleic Acid Test Results Lab Results      Component                Value               Date                      [id number]              NEGATIVE            05/04/2020          )      Anesthesia Quick Evaluation

## 2020-05-06 NOTE — Transfer of Care (Signed)
Immediate Anesthesia Transfer of Care Note  Patient: [name]  Procedure(s) Performed: RIGHT XI ROBOTIC ASSISTED THORASCOPY EXCISION OF CHEST WALL MASS AND INTERCOSTAL NERVE BLOCK (Right Chest)  Patient Location: PACU  Anesthesia Type:General  Level of Consciousness: awake, alert  and oriented  Airway & Oxygen Therapy: Patient Spontanous Breathing and Patient connected to face mask oxygen  Post-op Assessment: Report given to RN, Post -op Vital signs reviewed and stable and Patient moving all extremities  Post vital signs: Reviewed and stable  Last Vitals:  Vitals Value Taken Time  BP 156/80 05/06/20 0933  Temp    Pulse 69 05/06/20 0934  Resp 21 05/06/20 0934  SpO2 100 % 05/06/20 0934  Vitals shown include unvalidated device data.  Last Pain:  Vitals:   05/06/20 0612  TempSrc:   PainSc: 4          Complications: No complications documented.

## 2020-05-06 NOTE — Hospital Course (Signed)
HPI:   [name] 64 y.o. male presents for surgical evaluation of a right-sided pleural based chest wall mass.  The patient states that in July of this year he sought care for right axillary pain and swelling.  He was prescribed antibiotics by an urgent care and had some improvement but the pain persisted.  He then presented to the emergency department where he underwent cross-sectional imaging which identified a right pleural-based mass.   In regards to his symptoms he complains of throbbing pain starting in his back with numbness and tingling that radiates around his chest wall and sticks to 1 main band.  The pain is constant he is unsure of any relieving or aggravating factors.   He states that he occasionally does have some shortness of breath when climbing up 2 flights of stairs but has 25 acres and walks it regularly without any chest pain or dyspnea.  His weight and energy have been stable.  He has had some stress about what is going on with his pain.   He last had a colonoscopy about 4 years ago at which time polyps were removed but there was no evidence of malignancy.  This is all per his report.   Smoking Hx: Quit smoking in [place] Hospital Course:   Mr. Stuber is a 64 year old gentleman who underwent a right robotic-assisted thoracoscopic with excision of a chest wall mass and intercostal nerve block with Dr. Kipp Brood. He tolerated the procedure well and was transfer to the cardiac telemetry unit in stable condition. His chest tube was placed to water seal right out of the OR.

## 2020-05-06 NOTE — Anesthesia Procedure Notes (Signed)
Arterial Line Insertion Start/End9/29/2021 6:50 AM, 05/06/2020 6:55 AM Performed by: Colin Benton, CRNA, CRNA  Patient location: Pre-op. Preanesthetic checklist: patient identified, IV checked, site marked, risks and benefits discussed, surgical consent, monitors and equipment checked, pre-op evaluation, timeout performed and anesthesia consent Lidocaine 1% used for infiltration Left, radial was placed Catheter size: 20 G Hand hygiene performed , maximum sterile barriers used  and Seldinger technique used Allen's test indicative of satisfactory collateral circulation Attempts: 1 Procedure performed without using ultrasound guided technique. Following insertion, dressing applied and Biopatch. Post procedure assessment: normal and unchanged  Patient tolerated the procedure well with no immediate complications.

## 2020-05-06 NOTE — Anesthesia Procedure Notes (Signed)
Procedure Name: Intubation Date/Time: 05/06/2020 7:55 AM Performed by: Colin Benton, CRNA Pre-anesthesia Checklist: Patient identified, Emergency Drugs available, Suction available and Patient being monitored Patient Re-evaluated:Patient Re-evaluated prior to induction Oxygen Delivery Method: Circle System Utilized Preoxygenation: Pre-oxygenation with 100% oxygen Induction Type: IV induction Ventilation: Oral airway inserted - appropriate to patient size and Mask ventilation without difficulty Laryngoscope Size: Mac and 4 Grade View: Grade II Tube type: Oral Endobronchial tube: Double lumen EBT, Left, EBT position confirmed by fiberoptic bronchoscope and EBT position confirmed by auscultation and 39 Fr Number of attempts: 1 Airway Equipment and Method: Stylet and Oral airway Placement Confirmation: ETT inserted through vocal cords under direct vision,  positive ETCO2 and breath sounds checked- equal and bilateral Tube secured with: Tape Dental Injury: Teeth and Oropharynx as per pre-operative assessment  Comments: Performed by Heide Scales, SRNA

## 2020-05-06 NOTE — Plan of Care (Signed)
Continue to monitor

## 2020-05-06 NOTE — Op Note (Signed)
[address]       [place] [address]             [phone]        05/06/2020  Patient:  [name] Pre-Op Dx: Chest wall mass   Post-op Dx:  same Procedure: - Robotic assisted right video thoracoscopy - Resection of chest wall mass - Intercostal nerve block  Surgeon and Role:      * [name], Lucile Crater, MD - Primary    * T. Harriet Pho, PA-C - assisting  Anesthesia  general EBL:  Minimal  Blood Administration: none Specimen:  Chest wall mass  Drains: 51 F argyle chest tube in right chest Counts: correct   Indications: 64 year old male with a 2.1 cm lateral chest wall lesion. It appears pleural based on imaging. There is not significant uptake on PET CT, and there is evidence of calcifications within the lesion indicating some chronicity. Given his symptoms I am concerned that there is an intercostal nerve involvement and explained to him that for resection the paresthesias would improve proved but he likely would have some continued numbness.  Additionally he has some hepatic lymphadenopathy with increased SUV uptake which is concerning. There is concern that this along with the pleural-based lesion could be metastatic disease from an unknown source. I recommended that he undergo right robotic assisted thoracoscopy with resection of this pleural-based mass. Hopefully this will help [place] better delineate the potential source of his abdominal lymphadenopathy as well. I will also discuss his case with Dr. Bobbye Robinson for any recommendations for his abdominal lymphadenopathy. The risks, and benefits have been discussed and he is agreeable to proceed.  Findings: 2cm smooth soft tissue mass arising from the inferior surface of the 5th rib.  2cm border of pleura was excised circumferentially.  The tumor was densely adherent to the undersurface of the rib.  After the resection, clips were used to mark the resection bed.    Operative Technique: After the  risks, benefits and alternatives were thoroughly discussed, the patient was brought to the operative theatre.  Anesthesia was induced, and the patient was then placed in a left lateral decubitus position and was prepped and draped in normal sterile fashion.  An appropriate surgical pause was performed, and pre-operative antibiotics were dosed accordingly.  We began by placing our 3 robotic ports in the the 8th intercostal space targeting the chest wall mass.  It was coming off of the fifth rib.  The robot was then docked and all instruments were passed under direct visualization.    We began by scoring the pleura 2 cm away from the lesion.  This was done in a circumferential matter.  The tumor was then grasped and gently peeled off of the chest wall.  It was densely adherent to the undersurface of the rib.  It was completely detached from the chest wall.  Meticulous hemostasis was obtained.  The specimen was passed into an endocatch bag.  It was removed from the anterior port site.  The robot was then undocked.   An intercostal nerve block was performed under direct visualization.  Clips were then placed around the resection bed.  A 19 F chest with then placed, and we watch the remaining lobes re-expand.  The skin and soft tissue were closed with absorbable suture    The patient tolerated the procedure without any immediate complications, and was transferred to the PACU in stable condition.  [name] [name]

## 2020-05-07 ENCOUNTER — Other Ambulatory Visit: Payer: Self-pay | Admitting: Surgery

## 2020-05-07 DIAGNOSIS — R222 Localized swelling, mass and lump, trunk: Secondary | ICD-10-CM

## 2020-05-07 LAB — BASIC METABOLIC PANEL
Anion gap: 15 (ref 5–15)
BUN: 21 mg/dL (ref 8–23)
CO2: 22 mmol/L (ref 22–32)
Calcium: 9.1 mg/dL (ref 8.9–10.3)
Chloride: 99 mmol/L (ref 98–111)
Creatinine, Ser: 1.32 mg/dL — ABNORMAL HIGH (ref 0.61–1.24)
GFR calc Af Amer: >60 mL/min (ref >60)
GFR calc non Af Amer: 56 mL/min — ABNORMAL LOW (ref >60)
Glucose, Bld: 125 mg/dL — ABNORMAL HIGH (ref 70–99)
Potassium: 4.2 mmol/L (ref 3.5–5.1)
Sodium: 136 mmol/L (ref 135–145)

## 2020-05-07 LAB — GLUCOSE, CAPILLARY: Glucose-Capillary: 116 mg/dL — ABNORMAL HIGH (ref 70–99)

## 2020-05-07 LAB — SURGICAL PATHOLOGY

## 2020-05-07 LAB — CBC
HCT: 40 % (ref 39.0–52.0)
Hemoglobin: 13.4 g/dL (ref 13.0–17.0)
MCH: 28.6 pg (ref 26.0–34.0)
MCHC: 33.5 g/dL (ref 30.0–36.0)
MCV: 85.5 fL (ref 80.0–100.0)
Platelets: 167 10*3/uL (ref 150–400)
RBC: 4.68 MIL/uL (ref 4.22–5.81)
RDW: 13 % (ref 11.5–15.5)
WBC: 12.6 10*3/uL — ABNORMAL HIGH (ref 4.0–10.5)
nRBC: 0 % (ref 0.0–0.2)

## 2020-05-07 MED ORDER — ACETAMINOPHEN 500 MG PO TABS: 1000.0000 mg | ORAL_TABLET | Freq: Four times a day (QID) | ORAL | 0 refills | Status: AC

## 2020-05-07 MED ORDER — TRAMADOL HCL 50 MG PO TABS: 50.0000 mg | ORAL_TABLET | Freq: Four times a day (QID) | ORAL | 0 refills | Status: AC | PRN

## 2020-05-07 NOTE — Progress Notes (Signed)
Pt provided discharge instructions and education. Pt had IV's removed and intact. Vitals stable. Pt denies complaints. Tele box removed/ccmd notified. Pt has all belongings. Pt to be tx via valet to meet ride.  Jerald Kief, RN

## 2020-05-07 NOTE — Progress Notes (Signed)
      [address]       [place] [address]             [phone]      1 Day Post-Op Procedure(s) (LRB): RIGHT XI ROBOTIC ASSISTED THORASCOPY EXCISION OF CHEST WALL MASS AND INTERCOSTAL NERVE BLOCK (Right) Subjective: Feels good this morning. Asking to go home  Objective: Vital signs in last 24 hours: Temp:  [97.5 F (36.4 C)-98.3 F (36.8 C)] 98.2 F (36.8 C) (09/29 2300) Pulse Rate:  [57-74] 74 (09/29 2300) Cardiac Rhythm: Normal sinus rhythm;Heart block (09/29 1900) Resp:  [10-23] 20 (09/29 2300) BP: (129-169)/(62-95) 129/62 (09/29 2300) SpO2:  [92 %-100 %] 97 % (09/29 2300) Arterial Line BP: (138-177)/(66-84) 177/84 (09/29 1155) Weight:  [90.7 kg] 90.7 kg (09/29 1337)     Intake/Output from previous day: 09/29 0701 - 09/30 0700 In: 1300 [I.V.:1000; IV Piggyback:300] Out: 904 [Urine:750; Drains:129; Blood:25] Intake/Output this shift: No intake/output data recorded.  General appearance: alert, cooperative and no distress Heart: regular rate and rhythm, S1, S2 normal, no murmur, click, rub or gallop Lungs: clear to auscultation bilaterally Abdomen: soft, non-tender; bowel sounds normal; no masses,  no organomegaly Extremities: extremities normal, atraumatic, no cyanosis or edema Wound: clean and dry  Lab Results: Recent Labs    05/04/20 1215 05/04/20 1215 05/06/20 0710 05/07/20 0247  WBC 7.3  --   --  12.6*  HGB 13.6   < > 13.3 13.4  HCT 42.9   < > 39.0 40.0  PLT 205  --   --  167   < > = values in this interval not displayed.   BMET:  Recent Labs    05/04/20 1215 05/04/20 1215 05/06/20 0710 05/07/20 0247  NA 139   < > 138 136  K 3.9   < > 3.6 4.2  CL 102  --   --  99  CO2 25  --   --  22  GLUCOSE 93  --   --  125*  BUN 14  --   --  21  CREATININE 1.30*  --   --  1.32*  CALCIUM 8.9  --   --  9.1   < > = values in this interval not displayed.    PT/INR:  Recent Labs    05/04/20 1215  LABPROT 13.0  INR 1.0   ABG      Component Value Date/Time   PHART 7.450 05/06/2020 0710   HCO3 27.4 05/06/2020 0710   TCO2 29 05/06/2020 0710   O2SAT 97.0 05/06/2020 0710   CBG (last 3)  Recent Labs    05/06/20 1655 05/06/20 2115 05/07/20 0618  GLUCAP 134* 129* 116*    Assessment/Plan: S/P Procedure(s) (LRB): RIGHT XI ROBOTIC ASSISTED THORASCOPY EXCISION OF CHEST WALL MASS AND INTERCOSTAL NERVE BLOCK (Right)  1. CV-MSR in the 70s, BP well controlled 2. Pulm- chest tube removal today. 3. Labs have been stable.   Plan: Will arrange follow-up for him next Friday with Dr. Kipp Brood. Discharge today      LOS: 1 day    Elgie Collard 05/07/2020

## 2020-05-07 NOTE — Progress Notes (Signed)
Pt ambulated 500 feet without incident. Pt preparing for d/c.  Jerald Kief, RN

## 2020-05-07 NOTE — Progress Notes (Signed)
JP drain removed per order. . 75cc of serosanguineous drainage noted. Pt tolerated well. Pt to ambulate and discharge. Jerald Kief, RN

## 2020-05-07 NOTE — Discharge Instructions (Signed)
Robot-Assisted Thoracic Surgery    Robot-assisted thoracic surgery is a procedure in which robotic arms and surgical instruments are used to perform complex procedures through small incisions. During surgery, the surgeon sits at a console in the operating room and uses controls at the console to move the robotic arms. Surgical instruments are attached to the ends of the robotic arms. Instruments include a tool with a light and camera on the end of it (thoracoscope). The camera sends images to a video monitor that your surgeon will use to view the inside of your thoracic area. The thoracic area is between the neck and abdomen. Robot-assisted surgery may be done to:  Remove a tissue sample to be tested in a lab (biopsy).  Remove a part of a lung (lobectomy) or the whole lung (pneumonectomy).  Remove tumors.  Treat other conditions that affect: ? Your esophagus. This is the part of your body that carries food and liquid from your mouth to your stomach. ? Your diaphragm. This is a muscle wall between your lungs and stomach area. It helps with breathing.  Remove a portion of the nerves (sympathectomy) that cause excessive sweating. Tell a health care provider about:  Any allergies you have. This is especially important if you have allergies to medicines or sedatives.  All medicines you are taking, including vitamins, herbs, eye drops, creams, and over-the-counter medicines.  Any problems you or family members have had with anesthetic medicines.  Any blood disorders you have.  Any surgeries you have had.  Any medical conditions you have.  Whether you are pregnant or may be pregnant. What are the risks? Generally, this is a safe procedure. However, problems may occur, including:  Infection, such as pneumonia.  Severe bleeding (hemorrhage).  Allergic reactions to medicines.  Damage to organs or structures such as nerves or blood vessels. What happens before the  procedure? Medicines  Ask your health care provider about: ? Changing or stopping your regular medicines. This is especially important if you are taking diabetes medicines or blood thinners. ? Taking medicines such as aspirin and ibuprofen. These medicines can thin your blood. Do not take these medicines unless your health care provider tells you to take them. ? Taking over-the-counter medicines, vitamins, herbs, and supplements.  Before you go into the operating room, you may be given antibiotic medicine to help prevent infection.  Talk with your health care provider about safe and effective ways to manage pain before and after your procedure. Pain management should fit your specific health needs. Staying hydrated Follow instructions from your health care provider about hydration, which may include:  Up to 2 hours before the procedure - you may continue to drink clear liquids, such as water, clear fruit juice, black coffee, and plain tea. Eating and drinking restrictions Follow instructions from your health care provider about eating and drinking, which may include:  8 hours before the procedure - stop eating heavy meals or foods such as meat, fried foods, or fatty foods.  6 hours before the procedure - stop eating light meals or foods, such as toast or cereal.  6 hours before the procedure - stop drinking milk or drinks that contain milk.  2 hours before the procedure - stop drinking clear liquids. General instructions  Ask your health care provider how your surgical site will be marked or identified.  You may have tests, such as: ? CT scan. ? Ultrasound. ? Chest X-ray. ? Electrocardiogram (ECG).  You may have a blood or urine   sample taken.  Do not use any products that contain nicotine or tobacco, such as cigarettes and e-cigarettes. If you need help quitting, ask your health care provider.  You may be asked to shower with a germ-killing soap.  Plan to have someone take  you home from the hospital or clinic.  Plan to have a responsible adult care for you for at least 24 hours after you leave the hospital or clinic. This is important. What happens during the procedure?  To lower your risk of infection: ? Your health care team will wash or sanitize their hands. ? Hair may be removed from the surgical area. ? Your skin will be washed with soap.  An IV will be inserted into one of your veins.  You will be given one or more of the following: ? A medicine to help you relax (sedative). ? A medicine to make you fall asleep (general anesthetic). ? A medicine that is injected into an area of your body to numb everything below the injection site (regional anesthetic).  One to four small incisions will be made. The number of incisions and the area where incisions are made will depend on the purpose of your procedure.  Necessary procedures will be performed using the robotic system.  A drainage tube may be placed to drain excess fluid from the surgical area.  Your incisions will be closed with stitches (sutures) or staples and covered with a bandage (dressing). The procedure may vary among health care providers and hospitals. What happens after the procedure?  Your blood pressure, heart rate, breathing rate, and blood oxygen level will be monitored until the medicines you were given have worn off.  You may have a drainage tube in place. It may stay in place for a few days after the procedure to monitor for signs of air or fluid buildup in your lungs.  You may have to wear compression stockings. These stockings help to prevent blood clots and reduce swelling in your legs. Summary  Robot-assisted thoracic surgery is a procedure in which robotic arms and surgical instruments are used to help perform complex procedures through small incisions. During surgery, the surgeon sits at a console in the operating room and uses controls at the console to move the robotic  arms.  Compared to surgery between the neck and abdomen that is done by hand (traditional thoracic surgery), robot-assisted surgery allows the surgeon to perform complex procedures more easily.  A drainage tube may be placed to drain excess fluid from the surgical area. The tube will be closely monitored for fluid or air buildup in your lungs. This information is not intended to replace advice given to you by your health care provider. Make sure you discuss any questions you have with your health care provider. Document Revised: 05/24/2019 Document Reviewed: 11/28/2016 Elsevier Patient Education  2020 [employer]  

## 2020-05-07 NOTE — Discharge Summary (Signed)
[address]       [place] [address]             [phone]      Physician Discharge Summary  Patient ID: [name] MRN: [id number] DOB/AGE: 1956/02/14 64 y.o.  Admit date: 05/06/2020 Discharge date: 05/07/2020  Admission Diagnoses:  Patient Active Problem List   Diagnosis Date Noted  . Chest wall mass 05/01/2020  . Benign essential hypertension 04/30/2020  . Primary erectile dysfunction 04/30/2020  . H/O colonoscopy with polypectomy 07/06/2011  . Thrombocytopenia ([place]) 12/09/2009  . Colon cancer screening 12/01/2009  . Hypertension 11/30/2007  . Insomnia 11/30/2007  . Stress 11/30/2007  . Hyperlipidemia, unspecified 12/30/2005  . Psychosexual dysfunction with inhibited sexual excitement 12/30/2005  . Esophageal reflux 11/30/2005    Discharge Diagnoses:  Active Problems:   S/P robot-assisted surgical procedure   Discharged Condition: good  HPI:  [name] y.o.malepresents for surgical evaluation of a right-sided pleural based chest wall mass. The patient states that in July of this year he sought care for right axillary pain and swelling. He was prescribed antibiotics by an urgent care and had some improvement but the pain persisted. He then presented to the emergency department where he underwent cross-sectional imaging which identified a right pleural-based mass.In regards to his symptoms he complains of throbbing pain starting in his back with numbness and tingling that radiates around his chest wall and sticks to 1 main band. The pain is constant he is unsure of any relieving or aggravating factors.He states that he occasionally does have some shortness of breath when climbing up 2 flights of stairs but has 25 acres and walks it regularly without any chest pain or dyspnea. His weight and energy have been stable. He has had some stress about what is going on with his pain. He last had a colonoscopy about 4 years ago at which  time polyps were removed but there was no evidence of malignancy. This is all per his report.  Hospital Course:   [name] underwent a robotic assisted resection of a chest wall mass with Dr. Kipp Brood. He tolerated the procedure well and was transferred to the cardiac telemetry floor. POD 1 he was in normal sinus rhythm and tolerating room air. We removed his chest tube and he tolerated it well. He was stable to discharge home.    Consults: None  Significant Diagnostic Studies:   CLINICAL DATA:  Status post right chest wall mass excision  EXAM: PORTABLE CHEST 1 VIEW  COMPARISON:  05/04/2020  FINDINGS: Cardiac shadow is within normal limits. The lungs are well aerated bilaterally. No pneumothorax is seen. No focal infiltrate is seen. No bony abnormality is noted.  IMPRESSION: No acute abnormality seen   Electronically Signed   By: Inez Catalina M.D.   On: 05/06/2020 10:24   Treatments:    Discharge Exam: Blood pressure (!) 148/75, pulse 66, temperature 98.4 F (36.9 C), temperature source Oral, resp. rate 18, height 5\' 11"  (1.803 m), weight 90.7 kg, SpO2 97 %.  General appearance: alert, cooperative and no distress Heart: regular rate and rhythm, S1, S2 normal, no murmur, click, rub or gallop Lungs: clear to auscultation bilaterally Abdomen: soft, non-tender; bowel sounds normal; no masses,  no organomegaly Extremities: extremities normal, atraumatic, no cyanosis or edema Wound: clean and dry  Disposition: Discharge disposition: 01-Home or Self Care        Allergies as of 05/07/2020  Reactions   Aspirin Other (See Comments)   Gi upset   Ibuprofen    Other reaction(s): Abdominal Pain      Medication List    STOP taking these medications   Aleve PM 220-25 MG Tabs Generic drug: Naproxen Sod-diphenhydrAMINE   bisoprolol-hydrochlorothiazide 10-6.25 MG tablet Commonly known as: ZIAC   HYDROcodone-acetaminophen 5-325 MG tablet Commonly  known as: NORCO/VICODIN     TAKE these medications   acetaminophen 500 MG tablet Commonly known as: TYLENOL Take 2 tablets (1,000 mg total) by mouth every 6 (six) hours.   hydrochlorothiazide 12.5 MG tablet Commonly known as: HYDRODIURIL Take 12.5 mg by mouth daily.   omeprazole 20 MG capsule Commonly known as: PRILOSEC Take 20 mg by mouth daily.   tiZANidine 4 MG tablet Commonly known as: ZANAFLEX Take 4 mg by mouth every 6 (six) hours as needed for muscle spasms.   traMADol 50 MG tablet Commonly known as: ULTRAM Take 1-2 tablets (50-100 mg total) by mouth every 6 (six) hours as needed (mild pain).       Follow-up Information    Lightfoot, Lucile Crater, MD Follow up.   Specialty: Cardiothoracic Surgery Why: follow-up next Friday 10/8 Contact information: [address] [place] [address] [phone]               Signed: Elgie Collard 05/07/2020, 3:28 PM

## 2020-05-07 NOTE — Anesthesia Postprocedure Evaluation (Signed)
Anesthesia Post Note  Patient: [name]  Procedure(s) Performed: RIGHT XI ROBOTIC ASSISTED THORASCOPY EXCISION OF CHEST WALL MASS AND INTERCOSTAL NERVE BLOCK (Right Chest)     Patient location during evaluation: PACU Anesthesia Type: General Level of consciousness: awake and alert Pain management: pain level controlled Vital Signs Assessment: post-procedure vital signs reviewed and stable Respiratory status: spontaneous breathing, nonlabored ventilation, respiratory function stable and patient connected to nasal cannula oxygen Cardiovascular status: blood pressure returned to baseline and stable Postop Assessment: no apparent nausea or vomiting Anesthetic complications: no   No complications documented.  Last Vitals:  Vitals:   05/06/20 2300 05/07/20 0817  BP: 129/62 (!) 148/75  Pulse: 74 66  Resp: 20 18  Temp: 36.8 C 36.9 C  SpO2: 97% 97%    Last Pain:  Vitals:   05/07/20 0817  TempSrc: Oral  PainSc:                  March Rummage Ellisha Bankson

## 2020-05-14 ENCOUNTER — Other Ambulatory Visit: Payer: Self-pay

## 2020-05-14 ENCOUNTER — Ambulatory Visit (INDEPENDENT_AMBULATORY_CARE_PROVIDER_SITE_OTHER): Payer: Self-pay | Admitting: Thoracic Surgery (Cardiothoracic Vascular Surgery)

## 2020-05-14 ENCOUNTER — Encounter: Payer: Self-pay | Admitting: Thoracic Surgery (Cardiothoracic Vascular Surgery)

## 2020-05-14 VITALS — BP 159/95 | HR 88 | Temp 98.4°F | Resp 21 | Wt 200.2 lb

## 2020-05-14 DIAGNOSIS — Z9889 Other specified postprocedural states: Secondary | ICD-10-CM

## 2020-05-14 DIAGNOSIS — R222 Localized swelling, mass and lump, trunk: Secondary | ICD-10-CM

## 2020-05-14 MED ORDER — PREGABALIN 25 MG PO CAPS: 25.0000 mg | ORAL_CAPSULE | Freq: Two times a day (BID) | ORAL | 0 refills | Status: AC

## 2020-05-14 NOTE — Progress Notes (Signed)
      [address]       [name] [address]             [phone]        [name] [place] Medical Record #[id number] Date of Birth: [birth year]  Referring: Jesusita Oka, MD Primary Care: Patient, No Pcp Per Primary Cardiologist:No primary care provider on file.  Reason for visit:   follow-up  History of Present Illness:     Mr. [name] comes in for his 1 week follow-up appointment.  He complains of similar numbness and tingling along the chest wall as well as some incisional pain.  Physical Exam: BP (!) 159/95 (BP Location: Left Arm, Patient Position: Sitting, Cuff Size: Large)   Pulse 88   Temp 98.4 F (36.9 C) (Skin)   Resp (!) 21   Wt 200 lb 3.2 oz (90.8 kg)   SpO2 98% Comment: RA  BMI 27.92 kg/m   Alert NAD Incision clean Abdomen soft, ND No peripheral edema   Diagnostic Studies & Laboratory data:  Path: A. SOFT TISSUE MASS, RIGHT CHEST WALL, EXCISION:  - Findings consistent with resolving fat necrosis.  - No evidence of malignancy identified.    Assessment / Plan:   64 year old male status post right robotic assisted thoracoscopy with excision of a chest wall mass.  The mass was consistent of fat necrosis with no evidence of malignancy.  In regards to his pain he was given a prescription for Lyrica.  He also has some abdominal lymphadenopathy which is being evaluated by [name].  He will follow-up with me in 1 month with a chest x-ray. Lajuana Matte 05/14/2020 5:06 PM

## 2020-05-20 ENCOUNTER — Other Ambulatory Visit: Payer: Self-pay | Admitting: Surgery

## 2020-05-20 DIAGNOSIS — R599 Enlarged lymph nodes, unspecified: Secondary | ICD-10-CM

## 2020-05-21 ENCOUNTER — Other Ambulatory Visit: Payer: Self-pay

## 2020-05-21 ENCOUNTER — Ambulatory Visit
Admission: RE | Admit: 2020-05-21 | Discharge: 2020-05-21 | Disposition: A | Discharge status: Home / Self Care | Payer: Medicare Other | Source: Ambulatory Visit | Attending: Surgery | Admitting: Surgery

## 2020-05-21 DIAGNOSIS — R599 Enlarged lymph nodes, unspecified: Secondary | ICD-10-CM

## 2020-05-21 MED ORDER — IOPAMIDOL (ISOVUE-300) INJECTION 61%
100.0000 mL | Freq: Once | INTRAVENOUS | Status: AC | PRN
  Administered 2020-05-21: 100 mL via INTRAVENOUS

## 2020-06-11 ENCOUNTER — Other Ambulatory Visit: Payer: Self-pay | Admitting: Thoracic Surgery (Cardiothoracic Vascular Surgery)

## 2020-06-11 DIAGNOSIS — Z9889 Other specified postprocedural states: Secondary | ICD-10-CM

## 2020-06-12 ENCOUNTER — Ambulatory Visit (INDEPENDENT_AMBULATORY_CARE_PROVIDER_SITE_OTHER): Payer: Self-pay | Admitting: Thoracic Surgery (Cardiothoracic Vascular Surgery)

## 2020-06-12 ENCOUNTER — Ambulatory Visit
Admission: RE | Admit: 2020-06-12 | Discharge: 2020-06-12 | Disposition: A | Discharge status: Home / Self Care | Payer: Medicare Other | Source: Ambulatory Visit | Attending: Thoracic Surgery (Cardiothoracic Vascular Surgery) | Admitting: Thoracic Surgery (Cardiothoracic Vascular Surgery)

## 2020-06-12 ENCOUNTER — Other Ambulatory Visit: Payer: Self-pay

## 2020-06-12 ENCOUNTER — Encounter: Payer: Self-pay | Admitting: Thoracic Surgery (Cardiothoracic Vascular Surgery)

## 2020-06-12 VITALS — BP 167/92 | HR 64 | Resp 18 | Ht 71.0 in | Wt 207.0 lb

## 2020-06-12 DIAGNOSIS — Z9889 Other specified postprocedural states: Secondary | ICD-10-CM

## 2020-06-12 NOTE — Progress Notes (Signed)
      [address]       [name] [address]             [phone]        [name] Medical Record #[id number] Date of Birth: 1956/07/13  Referring: Jesusita Oka, MD Primary Care: Patient, No Pcp Per Primary Cardiologist:No primary care provider on file.  Reason for visit:   follow-up  History of Present Illness:     Mr. [name] comes in for 1 month follow-up appointment.  He still complains of some right-sided chest wall paresthesias.  He is only taking his ibuprofen at night.  Overall he feels well in the morning but has some progressive paresthesias and tenderness along his incisions throughout the course of the day.  Physical Exam: BP (!) 167/92 (BP Location: Right Arm, Patient Position: Sitting)   Pulse 64   Resp 18   Ht 5\' 11"  (1.803 m)   Wt 207 lb (93.9 kg)   SpO2 92% Comment: RA with mask on  BMI 28.87 kg/m   Alert NAD Incision clean.   Abdomen soft, ND    Diagnostic Studies & Laboratory data: CXR: Clear     Assessment / Plan:   64 year old male status post right robotic-assisted thoracoscopy and resection of a chest wall mass.  The mass was negative for any malignancy.  He was also noted to have some abdominal lymphadenopathy which is being worked up by [name].  He is cleared from a surgical standpoint to resume all activities.  He can also follow-up with [name] for management of his abdominal lymphadenopathy.  Follow-up as needed  Lajuana Matte 06/12/2020 2:21 PM

## 2022-01-17 IMAGING — CT NM PET TUM IMG INITIAL (PI) SKULL BASE T - THIGH
1 of 7 series · 1 of 25 positions shown · non-contrast
Comparison: None.

CLINICAL DATA: Initial treatment strategy for lung nodule.

EXAM:
NUCLEAR MEDICINE PET SKULL BASE TO THIGH
TECHNIQUE: 10.1 mCi F-18 FDG was injected intravenously. Full-ring PET imaging
was performed from the skull base to thigh after the radiotracer. CT
data was obtained and used for attenuation correction and anatomic
localization.
Fasting blood glucose: 99 mg/dl

[Series 4: ct sk_thigh 5.0 b31f · axial · 5.0mm · 0.98mm/px · 1 of 237 slices shown]
[im 237/237  brain]
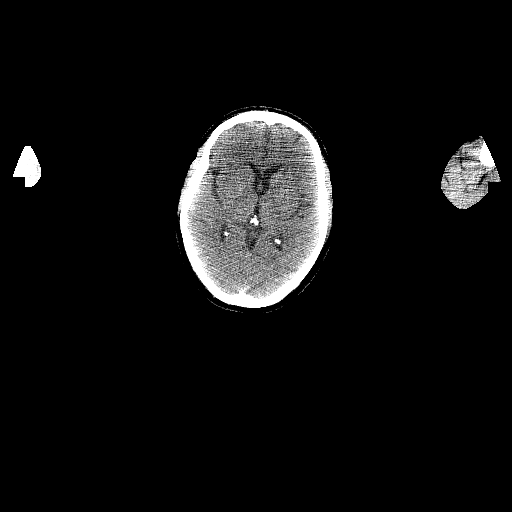

[1 of 25 positions shown; findings below may reference images not displayed]

FINDINGS: Mediastinal blood pool activity: SUV max

Liver activity: SUV max

NECK: No hypermetabolic lymph nodes in the neck.

Incidental CT findings: none

CHEST: Soft tissue nodule along the peripheral RIGHT chest appears
extrapleural based on the relationship to extrapleural fat and the
appearance of "ball under a rug" this measures 2.1 x 1.6 cm (image
77, series 4) (SUVmax = 3.4)

Mild increased FDG uptake about the RIGHT shoulder with scattered
small lymph nodes in this area displaying fatty hila. No focal
lesion seen in the area of the RIGHT shoulder and most of this
activity appears muscular or related to shoulder degenerative
changes.

Small lymph node on image 62 of series 4 with a maximum SUV of 3.6.
No LEFT axillary nodal activity at least none with the same degree
of activity.

Incidental CT findings: Lungs are clear. No effusion. No
consolidation. Airways are

Calcified generalized atherosclerosis and coronary artery disease.
Normal heart size. No pericardial effusion. Normal appearance of the
esophagus.

ABDOMEN/PELVIS: No focal hepatic uptake, splenic uptake or solid
organ uptake.

Increased metabolic activity of periportal lymph nodes (image 113,
series [DATE] cm celiac lymph node (SUVmax = 5.5)

On the same image a similar size lymph node, portacaval lymph node
with increased metabolic activity (SUVmax = 5.5)

No additional suspicious areas of uptake in the abdomen or pelvis.

Incidental CT findings: Splenic activity less than liver without
splenomegaly. No acute gastrointestinal process. Calcified
atheromatous plaque of the abdominal aorta, mild and without
aneurysmal dilation.

SKELETON: No focal hypermetabolic activity to suggest skeletal
metastasis.

Incidental CT findings: Extensive spinal degenerative changes.
IMPRESSION: 1. Nodule that appears extrapleural associated with small amount
calcification along the RIGHT chest wall shows low level FDG uptake
and well-circumscribed margins. Would consider fibrous tumor of the
pleura. Indolent neoplasm is favored. A metastatic lesion could have
a similar appearance. Correlate with any history of neoplasm.
2. Increased FDG uptake about periportal lymph nodes greater than
liver with mild enlargement are of uncertain significance.
Metastatic process or reactive changes are in the differential.
There is some mild nodularity of hepatic contours as well which
raises the question of background liver disease. Correlation with
liver enzymes may be helpful.
3. Mild activity about the RIGHT shoulder and small nodes in the
RIGHT axilla. By report the patient received [id number] vaccination
but history states LEFT arm. Correlate with the side of vaccination
as these findings could be explained by [id number] vaccine and
consider clinical follow-up based on appearance.
